# Patient Record
Sex: Male | Born: 1940 | Race: White | Hispanic: No | Marital: Married | Smoking: Never smoker
Health system: Southern US, Community
[De-identification: ages and names within clinical notes are randomized; demographics above are authoritative.]

## PROBLEM LIST (undated history)

## (undated) DIAGNOSIS — R0602 Shortness of breath: Secondary | ICD-10-CM

## (undated) DIAGNOSIS — I480 Paroxysmal atrial fibrillation: Secondary | ICD-10-CM

## (undated) DIAGNOSIS — N433 Hydrocele, unspecified: Secondary | ICD-10-CM

## (undated) DIAGNOSIS — N400 Enlarged prostate without lower urinary tract symptoms: Secondary | ICD-10-CM

## (undated) DIAGNOSIS — M1990 Unspecified osteoarthritis, unspecified site: Secondary | ICD-10-CM

## (undated) HISTORY — PX: CATARACT EXTRACTION W/ INTRAOCULAR LENS  IMPLANT, BILATERAL: SHX1307

## (undated) HISTORY — DX: Shortness of breath: R06.02

## (undated) HISTORY — PX: CARDIAC SURGERY: SHX584

---

## 2010-12-01 DIAGNOSIS — I4891 Unspecified atrial fibrillation: Secondary | ICD-10-CM

## 2010-12-05 ENCOUNTER — Encounter: Payer: Self-pay | Admitting: Physician Assistant

## 2010-12-15 ENCOUNTER — Encounter: Payer: Self-pay | Admitting: Cardiology

## 2010-12-16 ENCOUNTER — Encounter: Payer: Self-pay | Admitting: Cardiology

## 2010-12-16 ENCOUNTER — Ambulatory Visit (INDEPENDENT_AMBULATORY_CARE_PROVIDER_SITE_OTHER): Payer: 59 | Admitting: Cardiology

## 2010-12-16 DIAGNOSIS — R001 Bradycardia, unspecified: Secondary | ICD-10-CM

## 2010-12-16 DIAGNOSIS — R739 Hyperglycemia, unspecified: Secondary | ICD-10-CM

## 2010-12-16 DIAGNOSIS — I4891 Unspecified atrial fibrillation: Secondary | ICD-10-CM

## 2010-12-16 DIAGNOSIS — I498 Other specified cardiac arrhythmias: Secondary | ICD-10-CM

## 2010-12-16 DIAGNOSIS — R7309 Other abnormal glucose: Secondary | ICD-10-CM

## 2010-12-16 MED ORDER — FLECAINIDE ACETATE 50 MG PO TABS: 50.0000 mg | ORAL_TABLET | Freq: Two times a day (BID) | ORAL | Status: DC

## 2010-12-16 NOTE — Progress Notes (Signed)
HPI The patient presents for followup of hospitalization for atrial fibrillation and chest discomfort.  He converted spontaneously to sinus rhythm after beta blocker.  He had another episode for which he presented to the emergency room. This again converted spontaneously. This is now his third episode in one month there is I did review studies done as an outpatient which included echocardiogram with a normal ejection fraction and no valvular abnormalities. Carotid Dopplers demonstrating no hemodynamically significant stenosis. Stress perfusion imaging demonstrating normal EF no scar or ischemia.  The patient feels the palpitations when they occur. They may last up to 2 hours. He feels a rapid heart rate with lightheadedness. He does not report chest pressure, neck or arm discomfort. He does not report shortness of breath, PND or orthopnea. He otherwise is an active gentleman without symptoms.  No Known Allergies  Current Outpatient Prescriptions  Medication Sig Dispense Refill  . acetaminophen (TYLENOL) 325 MG tablet Take 650 mg by mouth every 6 (six) hours as needed.        Marland Kitchen aspirin 81 MG tablet Take 81 mg by mouth daily.        . diflunisal (DOLOBID) 500 MG TABS Take 500 mg by mouth 2 (two) times daily.        . metoprolol succinate (TOPROL-XL) 25 MG 24 hr tablet       . Tamsulosin HCl (FLOMAX) 0.4 MG CAPS Take 0.4 mg by mouth daily.        Marland Kitchen terazosin (HYTRIN) 2 MG capsule Take 2 mg by mouth at bedtime.        Marland Kitchen azithromycin (ZITHROMAX) 250 MG tablet Take 2 tablets by mouth on day 1, followed by 1 tablet by mouth daily for 4 days.      Marland Kitchen gabapentin (NEURONTIN) 300 MG capsule Take 300 mg by mouth at bedtime as needed.          Past Medical History  Diagnosis Date  . Atrial fibrillation     History of Paroxysmal  . Shortness of breath   . Other chest pain   . Other abnormal glucose   . Hypopotassemia   . Hypertrophy of prostate without urinary obstruction and other lower urinary tract  symptoms (LUTS)   . Pain in joint, shoulder region   . Encounter for long-term (current) use of aspirin   . Encounter for long-term (current) use of other medications   . Family history of other cardiovascular diseases     Past Surgical History  Procedure Date  . Cataract extraction, bilateral     ROS:  As stated in the HPI and negative for all other systems.  PHYSICAL EXAM BP 103/66  Pulse 45  Ht 5\' 8"  (1.727 m)  Wt 169 lb (76.658 kg)  BMI 25.70 kg/m2 GENERAL:  Well appearing HEENT:  Pupils equal round and reactive, fundi not visualized, oral mucosa unremarkable NECK:  No jugular venous distention, waveform within normal limits, carotid upstroke brisk and symmetric, no bruits, no thyromegaly LYMPHATICS:  No cervical, inguinal adenopathy LUNGS:  Clear to auscultation bilaterally BACK:  No CVA tenderness CHEST:  Unremarkable HEART:  PMI not displaced or sustained,S1 and S2 within normal limits, no S3, no S4, no clicks, no rubs, no murmurs ABD:  Flat, positive bowel sounds normal in frequency in pitch, no bruits, no rebound, no guarding, no midline pulsatile mass, no hepatomegaly, no splenomegaly EXT:  2 plus pulses throughout, no edema, no cyanosis no clubbing SKIN:  No rashes no nodules NEURO:  Cranial  nerves II through XII grossly intact, motor grossly intact throughout [facility]:  Cognitively intact, oriented to person place and time  EKG:  Sinus bradycardia, rate 58, leftward axis, intervals within normal limits, no acute ST-T wave changes  ASSESSMENT AND PLAN

## 2010-12-16 NOTE — Patient Instructions (Addendum)
   Begin Flecainide 50mg  twice a day    Stop Metoprolol  Follow up in  1 month - see appointment for above

## 2010-12-16 NOTE — Assessment & Plan Note (Signed)
I will defer to Criss Rosales, MD

## 2010-12-16 NOTE — Assessment & Plan Note (Signed)
At this point the patient is quite symptomatic with his fibrillation. He has no high risk features that would preclude treatment with antiarrhythmic and I will choose flecainide 50 mg b.i.d. He will come back in one month or sooner if he has recurrent symptoms. He will eventually need followup stress testing on this medication and drug level. We discussed the possibility of meds hydration or medication changes and briefly touched on atrial fibrillation ablation. Right now he is CHADS is 1 and he will continue with ASA.

## 2010-12-16 NOTE — Assessment & Plan Note (Signed)
I will discontinue his beta blocker which he did not tolerate in the past because of bradycardia arrhythmias.

## 2010-12-17 ENCOUNTER — Encounter: Payer: Self-pay | Admitting: Cardiology

## 2011-01-21 ENCOUNTER — Encounter: Payer: Self-pay | Admitting: Cardiology

## 2011-01-21 ENCOUNTER — Ambulatory Visit (INDEPENDENT_AMBULATORY_CARE_PROVIDER_SITE_OTHER): Payer: 59 | Admitting: Cardiology

## 2011-01-21 DIAGNOSIS — I498 Other specified cardiac arrhythmias: Secondary | ICD-10-CM

## 2011-01-21 DIAGNOSIS — I4891 Unspecified atrial fibrillation: Secondary | ICD-10-CM

## 2011-01-21 DIAGNOSIS — R001 Bradycardia, unspecified: Secondary | ICD-10-CM

## 2011-01-21 NOTE — Assessment & Plan Note (Signed)
He is happy with his response to this dose of flecainide. If he has breakthrough arrhythmias I will increase the dose. In one month he is on a stable dose I plan an exercise treadmill test.

## 2011-01-21 NOTE — Assessment & Plan Note (Signed)
I don't suspect is related to his feet but I will evaluate chronotropic at the time of his treadmill.

## 2011-01-21 NOTE — Progress Notes (Signed)
HPI The patient presents for followup of hospitalization for atrial fibrillation and chest discomfort.  He converted spontaneously to sinus rhythm after beta blocker.  He had another episode for which he presented to the emergency room. This again converted spontaneously. This is now his third episode in one month.  At the last visit I started Flecainide.  I stopped the beta blocker as well.  Since that time he had one self-limited episode of fibrillation. He has had a couple brief episodes as well. He denies any chest pressure, neck carotids or arm discomfort. He is in no presyncope or syncope. He is fatigued.  No Known Allergies  Current Outpatient Prescriptions  Medication Sig Dispense Refill  . acetaminophen (TYLENOL) 325 MG tablet Take 650 mg by mouth every 6 (six) hours as needed.        Marland Kitchen aspirin 325 MG EC tablet Take 325 mg by mouth daily.        . diflunisal (DOLOBID) 500 MG TABS Take 500 mg by mouth 2 (two) times daily.        . flecainide (TAMBOCOR) 50 MG tablet Take 1 tablet (50 mg total) by mouth 2 (two) times daily.  60 tablet  6  . gabapentin (NEURONTIN) 300 MG capsule Take 300 mg by mouth at bedtime as needed.        . Tamsulosin HCl (FLOMAX) 0.4 MG CAPS Take 0.4 mg by mouth daily.        Marland Kitchen terazosin (HYTRIN) 2 MG capsule Take 2 mg by mouth at bedtime.        Marland Kitchen DISCONTD: aspirin 81 MG tablet Take 81 mg by mouth daily.        Marland Kitchen DISCONTD: azithromycin (ZITHROMAX) 250 MG tablet Take 2 tablets by mouth on day 1, followed by 1 tablet by mouth daily for 4 days.        Past Medical History  Diagnosis Date  . Atrial fibrillation     History of Paroxysmal  . Shortness of breath   . Other chest pain   . Other abnormal glucose   . Hypopotassemia   . Hypertrophy of prostate without urinary obstruction and other lower urinary tract symptoms (LUTS)   . Pain in joint, shoulder region   . Encounter for long-term (current) use of aspirin   . Encounter for long-term (current) use of other  medications   . Family history of other cardiovascular diseases     Past Surgical History  Procedure Date  . Cataract extraction, bilateral     ROS:  As stated in the HPI and negative for all other systems.  PHYSICAL EXAM BP 120/70  Pulse 52  Resp 16  Ht 5\' 9"  (1.753 m)  Wt 166 lb (75.297 kg)  BMI 24.51 kg/m2 GENERAL:  Well appearing HEENT:  Pupils equal round and reactive, fundi not visualized, oral mucosa unremarkable NECK:  No jugular venous distention, waveform within normal limits, carotid upstroke brisk and symmetric, no bruits, no thyromegaly LYMPHATICS:  No cervical, inguinal adenopathy LUNGS:  Clear to auscultation bilaterally BACK:  No CVA tenderness CHEST:  Unremarkable HEART:  PMI not displaced or sustained,S1 and S2 within normal limits, no S3, no S4, no clicks, no rubs, no murmurs ABD:  Flat, positive bowel sounds normal in frequency in pitch, no bruits, no rebound, no guarding, no midline pulsatile mass, no hepatomegaly, no splenomegaly EXT:  2 plus pulses throughout, no edema, no cyanosis no clubbing SKIN:  No rashes no nodules NEURO:  Cranial nerves II  through XII grossly intact, motor grossly intact throughout [facility]:  Cognitively intact, oriented to person place and time  EKG:  Sinus rhythm, rate 52, axis within normal limits, intervals within normal limits, no acute ST-T wave changes.   ASSESSMENT AND PLAN

## 2011-01-21 NOTE — Patient Instructions (Signed)
You are being scheduled for an exercise treadmill test with Dr Antoine Poche.  Please follow the instruction sheet given. Continue all medications as listed.

## 2011-02-19 ENCOUNTER — Encounter: Payer: 59 | Admitting: Cardiology

## 2015-09-16 DIAGNOSIS — R05 Cough: Secondary | ICD-10-CM | POA: Diagnosis not present

## 2015-09-16 DIAGNOSIS — J209 Acute bronchitis, unspecified: Secondary | ICD-10-CM | POA: Diagnosis not present

## 2015-10-17 ENCOUNTER — Other Ambulatory Visit: Payer: Self-pay | Admitting: Urology

## 2015-10-23 ENCOUNTER — Encounter (HOSPITAL_BASED_OUTPATIENT_CLINIC_OR_DEPARTMENT_OTHER): Payer: Self-pay | Admitting: *Deleted

## 2015-10-24 ENCOUNTER — Encounter (HOSPITAL_BASED_OUTPATIENT_CLINIC_OR_DEPARTMENT_OTHER): Payer: Self-pay | Admitting: *Deleted

## 2015-10-24 NOTE — Progress Notes (Signed)
[facility] on arrival-instructed Npo after Mn-

## 2015-10-27 NOTE — Anesthesia Preprocedure Evaluation (Signed)
Anesthesia Evaluation  Patient identified by MRN, date of birth, ID band Patient awake    Reviewed: Allergy & Precautions, H&P , NPO status , Patient's Chart, lab work & pertinent test results  Airway Mallampati: II  TM Distance: >3 FB Neck ROM: full    Dental no notable dental hx. (+) Dental Advisory Given, Teeth Intact   Pulmonary neg pulmonary ROS, shortness of breath and with exertion,    Pulmonary exam normal breath sounds clear to auscultation       Cardiovascular Exercise Tolerance: Good negative cardio ROS Normal cardiovascular exam+ dysrhythmias Atrial Fibrillation  Rhythm:regular Rate:Normal     Neuro/Psych negative neurological ROS  negative psych ROS   GI/Hepatic negative GI ROS, Neg liver ROS,   Endo/Other  negative endocrine ROS  Renal/GU negative Renal ROS  negative genitourinary   Musculoskeletal   Abdominal   Peds  Hematology negative hematology ROS (+)   Anesthesia Other Findings   Reproductive/Obstetrics negative OB ROS                             Anesthesia Physical Anesthesia Plan  ASA: III  Anesthesia Plan: General   Post-op Pain Management:    Induction: Intravenous  Airway Management Planned: Oral ETT  Additional Equipment:   Intra-op Plan:   Post-operative Plan: Extubation in OR  Informed Consent: I have reviewed the patients History and Physical, chart, labs and discussed the procedure including the risks, benefits and alternatives for the proposed anesthesia with the patient or authorized representative who has indicated his/her understanding and acceptance.   Dental Advisory Given  Plan Discussed with: CRNA  Anesthesia Plan Comments:         Anesthesia Quick Evaluation

## 2015-10-28 ENCOUNTER — Ambulatory Visit (HOSPITAL_BASED_OUTPATIENT_CLINIC_OR_DEPARTMENT_OTHER): Payer: No Typology Code available for payment source | Admitting: Anesthesiology

## 2015-10-28 ENCOUNTER — Ambulatory Visit (HOSPITAL_BASED_OUTPATIENT_CLINIC_OR_DEPARTMENT_OTHER)
Admission: RE | Admit: 2015-10-28 | Discharge: 2015-10-28 | Disposition: A | Discharge status: Home / Self Care | Payer: No Typology Code available for payment source | Source: Ambulatory Visit | Attending: Urology | Admitting: Urology

## 2015-10-28 ENCOUNTER — Encounter (HOSPITAL_BASED_OUTPATIENT_CLINIC_OR_DEPARTMENT_OTHER)
Admission: RE | Disposition: A | Discharge status: Home / Self Care | Payer: Self-pay | Source: Ambulatory Visit | Attending: Urology

## 2015-10-28 ENCOUNTER — Encounter (HOSPITAL_BASED_OUTPATIENT_CLINIC_OR_DEPARTMENT_OTHER): Payer: Self-pay | Admitting: *Deleted

## 2015-10-28 DIAGNOSIS — I499 Cardiac arrhythmia, unspecified: Secondary | ICD-10-CM | POA: Diagnosis not present

## 2015-10-28 DIAGNOSIS — N400 Enlarged prostate without lower urinary tract symptoms: Secondary | ICD-10-CM | POA: Diagnosis not present

## 2015-10-28 DIAGNOSIS — I4891 Unspecified atrial fibrillation: Secondary | ICD-10-CM | POA: Diagnosis not present

## 2015-10-28 DIAGNOSIS — Z7982 Long term (current) use of aspirin: Secondary | ICD-10-CM | POA: Insufficient documentation

## 2015-10-28 DIAGNOSIS — I4510 Unspecified right bundle-branch block: Secondary | ICD-10-CM | POA: Diagnosis not present

## 2015-10-28 DIAGNOSIS — N433 Hydrocele, unspecified: Secondary | ICD-10-CM | POA: Insufficient documentation

## 2015-10-28 DIAGNOSIS — Z79899 Other long term (current) drug therapy: Secondary | ICD-10-CM | POA: Insufficient documentation

## 2015-10-28 HISTORY — PX: HYDROCELE EXCISION: SHX482

## 2015-10-28 HISTORY — DX: Unspecified osteoarthritis, unspecified site: M19.90

## 2015-10-28 HISTORY — DX: Paroxysmal atrial fibrillation: I48.0

## 2015-10-28 HISTORY — DX: Benign prostatic hyperplasia without lower urinary tract symptoms: N40.0

## 2015-10-28 HISTORY — DX: Hydrocele, unspecified: N43.3

## 2015-10-28 LAB — POCT HEMOGLOBIN-HEMACUE: Hemoglobin: 13.6 g/dL (ref 13.0–17.0)

## 2015-10-28 SURGERY — HYDROCELECTOMY
Anesthesia: General | Laterality: Right

## 2015-10-28 MED ORDER — BACITRACIN-NEOMYCIN-POLYMYXIN 400-5-5000 EX OINT
TOPICAL_OINTMENT | CUTANEOUS | Status: AC
Start: 2015-10-28 — End: 2015-10-28
  Filled 2015-10-28: qty 1

## 2015-10-28 MED ORDER — CEFAZOLIN SODIUM-DEXTROSE 2-4 GM/100ML-% IV SOLN
2.0000 g | INTRAVENOUS | Status: AC
  Administered 2015-10-28: 2 g via INTRAVENOUS
  Filled 2015-10-28: qty 100

## 2015-10-28 MED ORDER — EPHEDRINE SULFATE 50 MG/ML IJ SOLN
INTRAMUSCULAR | Status: DC | PRN
  Administered 2015-10-28 (×3): 10 mg via INTRAVENOUS

## 2015-10-28 MED ORDER — ONDANSETRON HCL 4 MG/2ML IJ SOLN
INTRAMUSCULAR | Status: DC | PRN
  Administered 2015-10-28: 4 mg via INTRAVENOUS

## 2015-10-28 MED ORDER — MIDAZOLAM HCL 2 MG/2ML IJ SOLN
INTRAMUSCULAR | Status: AC
  Filled 2015-10-28: qty 2

## 2015-10-28 MED ORDER — LACTATED RINGERS IV SOLN
INTRAVENOUS | Status: DC
  Filled 2015-10-28: qty 1000

## 2015-10-28 MED ORDER — HYDROCODONE-ACETAMINOPHEN 5-325 MG PO TABS
ORAL_TABLET | ORAL | Status: AC
  Filled 2015-10-28: qty 1

## 2015-10-28 MED ORDER — PROPOFOL 10 MG/ML IV BOLUS
INTRAVENOUS | Status: AC
  Filled 2015-10-28: qty 40

## 2015-10-28 MED ORDER — CEFAZOLIN SODIUM-DEXTROSE 2-3 GM-% IV SOLR
INTRAVENOUS | Status: AC
  Filled 2015-10-28: qty 50

## 2015-10-28 MED ORDER — BUPIVACAINE-EPINEPHRINE (PF) 0.5% -1:200000 IJ SOLN
INTRAMUSCULAR | Status: AC
  Filled 2015-10-28: qty 30

## 2015-10-28 MED ORDER — DEXAMETHASONE SODIUM PHOSPHATE 4 MG/ML IJ SOLN
INTRAMUSCULAR | Status: DC | PRN
  Administered 2015-10-28: 5 mg via INTRAVENOUS

## 2015-10-28 MED ORDER — FENTANYL CITRATE (PF) 100 MCG/2ML IJ SOLN
INTRAMUSCULAR | Status: AC
  Filled 2015-10-28: qty 2

## 2015-10-28 MED ORDER — HYDROCODONE-ACETAMINOPHEN 5-325 MG PO TABS
1.0000 | ORAL_TABLET | Freq: Once | ORAL | Status: AC
  Administered 2015-10-28: 1 via ORAL
  Filled 2015-10-28: qty 1

## 2015-10-28 MED ORDER — BUPIVACAINE-EPINEPHRINE 0.5% -1:200000 IJ SOLN
INTRAMUSCULAR | Status: DC | PRN
Start: 2015-10-28 — End: 2015-10-28
  Administered 2015-10-28: 16 mL

## 2015-10-28 MED ORDER — TRIPLE ANTIBIOTIC 3.5-400-5000 EX OINT
TOPICAL_OINTMENT | CUTANEOUS | Status: DC | PRN
  Administered 2015-10-28: 1 via TOPICAL

## 2015-10-28 MED ORDER — ONDANSETRON HCL 4 MG/2ML IJ SOLN
INTRAMUSCULAR | Status: AC
  Filled 2015-10-28: qty 2

## 2015-10-28 MED ORDER — FENTANYL CITRATE (PF) 100 MCG/2ML IJ SOLN
25.0000 ug | INTRAMUSCULAR | Status: DC | PRN
Start: 2015-10-28 — End: 2015-10-28
  Filled 2015-10-28: qty 1

## 2015-10-28 MED ORDER — LIDOCAINE HCL (CARDIAC) 20 MG/ML IV SOLN
INTRAVENOUS | Status: DC | PRN
  Administered 2015-10-28: 60 mg via INTRAVENOUS

## 2015-10-28 MED ORDER — DEXAMETHASONE SODIUM PHOSPHATE 10 MG/ML IJ SOLN
INTRAMUSCULAR | Status: AC
  Filled 2015-10-28: qty 1

## 2015-10-28 MED ORDER — FENTANYL CITRATE (PF) 100 MCG/2ML IJ SOLN
INTRAMUSCULAR | Status: DC | PRN
  Administered 2015-10-28: 12.5 ug via INTRAVENOUS
  Administered 2015-10-28 (×2): 25 ug via INTRAVENOUS
  Administered 2015-10-28: 12.5 ug via INTRAVENOUS
  Administered 2015-10-28: 25 ug via INTRAVENOUS

## 2015-10-28 MED ORDER — LACTATED RINGERS IV SOLN
INTRAVENOUS | Status: DC
  Administered 2015-10-28 (×2): via INTRAVENOUS
  Filled 2015-10-28: qty 1000

## 2015-10-28 MED ORDER — PROPOFOL 10 MG/ML IV BOLUS
INTRAVENOUS | Status: AC
  Filled 2015-10-28: qty 20

## 2015-10-28 MED ORDER — HYDROCODONE-ACETAMINOPHEN 5-325 MG PO TABS: 1.0000 | ORAL_TABLET | Freq: Four times a day (QID) | ORAL | Status: DC | PRN

## 2015-10-28 MED ORDER — PROPOFOL 10 MG/ML IV BOLUS
INTRAVENOUS | Status: DC | PRN
  Administered 2015-10-28: 160 mg via INTRAVENOUS
  Administered 2015-10-28: 40 mg via INTRAVENOUS

## 2015-10-28 SURGICAL SUPPLY — 45 items
BLADE CLIPPER SURG (BLADE) ×2 IMPLANT
BLADE SURG 15 STRL LF DISP TIS (BLADE) ×1 IMPLANT
BLADE SURG 15 STRL SS (BLADE) ×1
BNDG GAUZE ELAST 4 BULKY (GAUZE/BANDAGES/DRESSINGS) ×2 IMPLANT
CANISTER SUCTION 1200CC (MISCELLANEOUS) IMPLANT
CANISTER SUCTION 2500CC (MISCELLANEOUS) ×2 IMPLANT
CLEANER CAUTERY TIP 5X5 PAD (MISCELLANEOUS) IMPLANT
COVER BACK TABLE 60X90IN (DRAPES) ×2 IMPLANT
COVER MAYO STAND STRL (DRAPES) ×2 IMPLANT
DISSECTOR ROUND CHERRY 3/8 STR (MISCELLANEOUS) IMPLANT
DRAIN PENROSE 18X1/4 LTX STRL (WOUND CARE) IMPLANT
DRAPE LAPAROTOMY 100X72 PEDS (DRAPES) ×2 IMPLANT
ELECT REM PT RETURN 9FT ADLT (ELECTROSURGICAL) ×2
ELECTRODE REM PT RTRN 9FT ADLT (ELECTROSURGICAL) ×1 IMPLANT
GLOVE BIO SURGEON STRL SZ7 (GLOVE) ×2 IMPLANT
GLOVE BIO SURGEON STRL SZ8 (GLOVE) ×2 IMPLANT
GLOVE BIOGEL PI IND STRL 7.0 (GLOVE) ×2 IMPLANT
GLOVE BIOGEL PI IND STRL 8 (GLOVE) ×1 IMPLANT
GLOVE BIOGEL PI INDICATOR 7.0 (GLOVE) ×2
GLOVE BIOGEL PI INDICATOR 8 (GLOVE) ×1
GLOVE ECLIPSE 7.5 STRL STRAW (GLOVE) ×2 IMPLANT
GOWN STRL REUS W/ TWL LRG LVL3 (GOWN DISPOSABLE) ×1 IMPLANT
GOWN STRL REUS W/ TWL XL LVL3 (GOWN DISPOSABLE) ×2 IMPLANT
GOWN STRL REUS W/TWL LRG LVL3 (GOWN DISPOSABLE) ×1
GOWN STRL REUS W/TWL XL LVL3 (GOWN DISPOSABLE) ×2
KIT ROOM TURNOVER WOR (KITS) ×2 IMPLANT
NEEDLE HYPO 25X1 1.5 SAFETY (NEEDLE) ×2 IMPLANT
NS IRRIG 500ML POUR BTL (IV SOLUTION) ×2 IMPLANT
PACK BASIN DAY SURGERY FS (CUSTOM PROCEDURE TRAY) ×2 IMPLANT
PAD CLEANER CAUTERY TIP 5X5 (MISCELLANEOUS)
PENCIL BUTTON HOLSTER BLD 10FT (ELECTRODE) ×2 IMPLANT
SUPPORT SCROTAL LG STRP (MISCELLANEOUS) ×2 IMPLANT
SUT CHROMIC 3 0 SH 27 (SUTURE) ×4 IMPLANT
SUT ETHILON 3 0 PS 1 (SUTURE) IMPLANT
SUT SILK 4 0 TIES 17X18 (SUTURE) IMPLANT
SUT VIC AB 4-0 RB1 27 (SUTURE)
SUT VIC AB 4-0 RB1 27X BRD (SUTURE) IMPLANT
SUT VICRYL 6 0 RB 1 (SUTURE) IMPLANT
SYR CONTROL 10ML LL (SYRINGE) ×2 IMPLANT
TOWEL OR 17X24 6PK STRL BLUE (TOWEL DISPOSABLE) ×4 IMPLANT
TRAY DSU PREP LF (CUSTOM PROCEDURE TRAY) ×2 IMPLANT
TUBE CONNECTING 12X1/4 (SUCTIONS) ×2 IMPLANT
WATER STERILE IRR 3000ML UROMA (IV SOLUTION) IMPLANT
WATER STERILE IRR 500ML POUR (IV SOLUTION) IMPLANT
YANKAUER SUCT BULB TIP NO VENT (SUCTIONS) ×2 IMPLANT

## 2015-10-28 NOTE — Discharge Instructions (Signed)
Scrotal surgery postoperative instructions ° °Wound: ° °In most cases your incision will have absorbable sutures that will dissolve within the first 10-20 days. Some will fall out even earlier. Expect some redness as the sutures dissolved but this should occur only around the sutures. If there is generalized redness, especially with increasing pain or swelling, let us know. The scrotum will very likely get "black and blue" as the blood in the tissues spread. Sometimes the whole scrotum will turn colors. The black and blue is followed by a yellow and brown color. In time, all the discoloration will go away. In some cases some firm swelling in the area of the testicle may persist for up to 4-6 weeks after the surgery and is considered normal in most cases. ° °Diet: ° °You may return to your normal diet within 24 hours following your surgery. You may note some mild nausea and possibly vomiting the first 6-8 hours following surgery. This is usually due to the side effects of anesthesia, and will disappear quite soon. I would suggest clear liquids and a very light meal the first evening following your surgery. ° °Activity: ° °Your physical activity should be restricted the first 48 hours. During that time you should remain relatively inactive, moving about only when necessary. During the first 7-10 days following surgery he should avoid lifting any heavy objects (anything greater than 15 pounds), and avoid strenuous exercise. If you work, ask us specifically about your restrictions, both for work and home. We will write a note to your employer if needed. ° °You should plan to wear a tight pair of jockey shorts or an athletic supporter for the first 4-5 days, even to sleep. This will keep the scrotum immobilized to some degree and keep the swelling down. ° °Ice packs should be placed on and off over the scrotum for the first 48 hours. Frozen peas or corn in a ZipLock bag can be frozen, used and re-frozen. Fifteen minutes  on and 15 minutes off is a reasonable schedule. The ice is a good pain reliever and keeps the swelling down. ° °Hygiene: ° °You may shower 48 hours after your surgery. Tub bathing should be restricted until the seventh day. ° ° ° ° ° ° ° ° ° °Medication: ° °You will be sent home with some type of pain medication. In many cases you will be sent home with a narcotic pain pill (hydrococone or oxycodone). If the pain is not too bad, you may take either Tylenol (acetaminophen) or Advil (ibuprofen) which contain no narcotic agents, and might be tolerated a little better, with fewer side effects. If the pain medication you are sent home with does not control the pain, you will have to let us know. Some narcotic pain medications cannot be given or refilled by a phone call to a pharmacy. ° °Problems you should report to us: ° °· Fever of 101.0 degrees Fahrenheit or greater. °· Moderate or severe swelling under the skin incision or involving the scrotum. °· Drug reaction such as hives, a rash, nausea or vomiting. °·  ° ° °Post Anesthesia Home Care Instructions ° °Activity: °Get plenty of rest for the remainder of the day. A responsible adult should stay with you for 24 hours following the procedure.  °For the next 24 hours, DO NOT: °-Drive a car °-Operate machinery °-Drink alcoholic beverages °-Take any medication unless instructed by your physician °-Make any legal decisions or sign important papers. ° °Meals: °Start with liquid foods such as   gelatin or soup. Progress to regular foods as tolerated. Avoid greasy, spicy, heavy foods. If nausea and/or vomiting occur, drink only clear liquids until the nausea and/or vomiting subsides. Call your physician if vomiting continues. ° °Special Instructions/Symptoms: °Your throat may feel dry or sore from the anesthesia or the breathing tube placed in your throat during surgery. If this causes discomfort, gargle with warm salt water. The discomfort should disappear within 24  hours. ° °If you had a scopolamine patch placed behind your ear for the management of post- operative nausea and/or vomiting: ° °1. The medication in the patch is effective for 72 hours, after which it should be removed.  Wrap patch in a tissue and discard in the trash. Wash hands thoroughly with soap and water. °2. You may remove the patch earlier than 72 hours if you experience unpleasant side effects which may include dry mouth, dizziness or visual disturbances. °3. Avoid touching the patch. Wash your hands with soap and water after contact with the patch. °  ° °

## 2015-10-28 NOTE — Anesthesia Postprocedure Evaluation (Signed)
Anesthesia Post Note  Patient: Kyle Bentley  Procedure(s) Performed: Procedure(s) (LRB): RIGHT HYDROCELECTOMY  (Right)  Patient location during evaluation: PACU Anesthesia Type: General Level of consciousness: awake and alert Pain management: pain level controlled Vital Signs Assessment: post-procedure vital signs reviewed and stable Respiratory status: spontaneous breathing, nonlabored ventilation, respiratory function stable and patient connected to nasal cannula oxygen Cardiovascular status: blood pressure returned to baseline and stable Postop Assessment: no signs of nausea or vomiting Anesthetic complications: no    Last Vitals:  Filed Vitals:   10/28/15 0845 10/28/15 0900  BP: 130/60 125/60  Pulse: 69 65  Temp:    Resp: 13 10    Last Pain:  Filed Vitals:   10/28/15 0913  PainSc: 3                  Kattia Selley L

## 2015-10-28 NOTE — H&P (Signed)
History of Present Illness Elevated PSA: He was found to have a PSA of 4.25 in 6/16 with a PSA in 3/16 that was 4.68. No abnormality of the prostate was noted on DRE.      He does have BPH which has been managed with terazosin 5 mg daily. He said he is currently satisfied with his voiding pattern.     He has a right hydrocele that he said has had to have fluid drawn off of it in the past. He said he has not required any fluid to be drawn off of it in several years however and it is not causing him any difficulty in any way.    Interval history: When I saw him last I had recommended repeating his PSA in 3 months and again at 6 months and it appears he had his PSA done at the [place]. He brought those results with him and his PSA in 12/16 was actually normal at 3.95. It had decreased further and he remains symptomatic from a voiding standpoint at this time. He does however have a symptomatic right hydrocele. He said it is uncomfortable when he rides his motorcycle. He said in the past he has had the fluid aspirated but it keeps coming back and he asked if I would aspirate the fluid.   Past Medical History Problems  1. History of arthritis (Z87.39) 2. History of atrial fibrillation (Z86.79)  Surgical History Problems  1. History of No Surgical Problems  Current Meds 1. Aspirin EC Low Dose 81 MG Oral Tablet Delayed Release;  Therapy: (Recorded:13Sep2016) to Recorded 2. Flecainide Acetate 50 MG Oral Tablet;  Therapy: (Recorded:13Sep2016) to Recorded 3. Fluticasone Propionate 50 MCG/ACT Nasal Suspension;  Therapy: (Recorded:13Sep2016) to Recorded 4. Ibuprofen 200 MG Oral Tablet;  Therapy: (Recorded:13Sep2016) to Recorded 5. Terazosin HCl 5 MG TABS;  Therapy: (Recorded:13Sep2016) to Recorded 6. Vitamin D3 5000 UNIT Oral Tablet;  Therapy: (Recorded:13Sep2016) to Recorded  Allergies Medication  1. No Known Drug Allergies  Family History Problems  1. Family history of acute myocardial  infarction (Z82.49) : Father 2. Family history of cardiac disorder (Z82.49) : Father, Sister, Brother  Social History Problems  1. Denied: History of Alcohol use 2. Caffeine use (F15.90)   2 3. Father deceased 10. Married 5. Mother deceased 45. Never a smoker 7. Number of children   1 son; 1 daughter 35. Retired  Review of Systems Genitourinary, constitutional, skin, eye, otolaryngeal, hematologic/lymphatic, cardiovascular, pulmonary, endocrine, musculoskeletal, gastrointestinal, neurological and psychiatric system(s) were reviewed and pertinent findings if present are noted and are otherwise negative.  Genitourinary: urinary frequency, feelings of urinary urgency, nocturia, difficulty starting the urinary stream, weak urinary stream, urinary stream starts and stops, erectile dysfunction and initiating urination requires straining.  Gastrointestinal: constipation.  Constitutional: feeling tired (fatigue).  Respiratory: shortness of breath.  Musculoskeletal: joint pain.  Neurological: headache.      Physical Exam Constitutional: Well nourished and well developed . No acute distress.  ENT:. The ears and nose are normal in appearance.  Neck: The appearance of the neck is normal and no neck mass is present.  Pulmonary: No respiratory distress and normal respiratory rhythm and effort.  Cardiovascular: Heart rate and rhythm are normal . No peripheral edema.  Abdomen: The abdomen is soft and nontender. No masses are palpated. No CVA tenderness. No hernias are palpable. No hepatosplenomegaly noted.  Rectal: Rectal exam demonstrates normal sphincter tone, no tenderness and no masses. The prostate has no nodularity and is not tender. The left  seminal vesicle is nonpalpable. The right seminal vesicle is nonpalpable. The perineum is normal on inspection.  Genitourinary: Examination of the penis demonstrates no discharge, no masses, no lesions and a normal meatus. The penis is uncircumcised. The  scrotum is without lesions. Examination of the right scrotum demonstrates a hydrocele. The right epididymis is palpably normal and non-tender. The left epididymis is palpably normal and non-tender. The right testis is non-tender and without masses. The left testis is non-tender and without masses.  Lymphatics: The femoral and inguinal nodes are not enlarged or tender.  Skin: Normal skin turgor, no visible rash and no visible skin lesions.  Neuro/Psych:. Mood and affect are appropriate.      Vitals Vital Signs  Height: 5 ft 7 in Weight: 158 lb  BMI Calculated: 24.75 BSA Calculated: 1.83 Blood Pressure: 117 / 56 Heart Rate: 64   The following clinical lab reports were reviewed:  UA: Clear.    Assessment   His prostate is noted to be smooth and benign again today. His PSA has continued to fall and is currently 3.95. Yearly DRE and PSA has been my recommendation at this time.    He has a symptomatic right hydrocele. We discussed the treatment options and certainly continued repeat aspirations of the fluid is an option but I did not feel that that was likely the best option since it is not definitively treating the cause and it is resolving and recurrence as one would expect. I therefore have discussed hydrocelectomy as a more definitive treatment. We discussed the fact that it is a surgical procedure and I went over the incision used, the risks and complications, the probability of success, the outpatient nature of the procedure as well as the anticipated postoperative course. He would like to proceed with definitive therapy.     Plan   He is scheduled for a right hydrocelectomy.

## 2015-10-28 NOTE — Op Note (Signed)
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 His genitalia was then sterilely prepped and draped. An official timeout was then performed.  A midline median raphae scrotal incision was then made and carried down over the right hydrocele. The tissue over the hydrocele was cleared using a combination of sharp and blunt technique. The hydrocele was then opened, drained of clear amber fluid and delivered through the incision. The excess hydrocele sac tissue was excised with the Bovie electrocautery and then I cauterized the edges. I then reapproximated the edges posteriorly behind the epididymis with a running, locking 3-0 chromic suture. The appendix testis was removed with the Bovie.   His testicle was then replaced in the normal anatomic position and his right hemiscrotum. I then closed the deep scrotal tissue with running 3-0 chromic suture in a locking fashion. I injected quarter percent Marcaine in the subcutaneous tissue and closed the skin with running 3-0 chromic. Neosporin, a sterile gauze dressing, fluff Kerlix and a scrotal support were applied. The patient tolerated the procedure well no intraoperative complications. Needle sponge and instrument counts were correct at the end of the operation.   PLAN OF CARE: Discharge to home after PACU  PATIENT DISPOSITION:  PACU - hemodynamically stable.  I was present and participated in the entire operation with the resident.

## 2015-10-28 NOTE — Anesthesia Procedure Notes (Signed)
Procedure Name: LMA Insertion Date/Time: 10/28/2015 7:35 AM Performed by: Jessica PriestBEESON, Allyse Fregeau C Pre-anesthesia Checklist: Patient identified, Emergency Drugs available, Suction available and Patient being monitored Patient Re-evaluated:Patient Re-evaluated prior to inductionOxygen Delivery Method: Circle System Utilized Preoxygenation: Pre-oxygenation with 100% oxygen Intubation Type: IV induction Ventilation: Mask ventilation without difficulty LMA: LMA inserted LMA Size: 4.0 Number of attempts: 1 Airway Equipment and Method: Bite block Placement Confirmation: positive ETCO2 Tube secured with: Tape Dental Injury: Teeth and Oropharynx as per pre-operative assessment

## 2015-10-28 NOTE — Transfer of Care (Signed)
Immediate Anesthesia Transfer of Care Note  Patient: Kyle Bentley  Procedure(s) Performed: Procedure(s) (LRB): RIGHT HYDROCELECTOMY  (Right)  Patient Location: PACU  Anesthesia Type: General  Level of Consciousness: awake, sedated, patient cooperative and responds to stimulation  Airway & Oxygen Therapy: Patient Spontanous Breathing and Patient connected to face mask oxygen  Post-op Assessment: Report given to PACU RN, Post -op Vital signs reviewed and stable and Patient moving all extremities  Post vital signs: Reviewed and stable  Complications: No apparent anesthesia complications

## 2015-10-29 ENCOUNTER — Encounter (HOSPITAL_BASED_OUTPATIENT_CLINIC_OR_DEPARTMENT_OTHER): Payer: Self-pay | Admitting: Urology

## 2015-11-07 ENCOUNTER — Encounter (HOSPITAL_BASED_OUTPATIENT_CLINIC_OR_DEPARTMENT_OTHER): Payer: Self-pay | Admitting: Urology

## 2016-05-06 DIAGNOSIS — Z0100 Encounter for examination of eyes and vision without abnormal findings: Secondary | ICD-10-CM | POA: Diagnosis not present

## 2016-09-17 DIAGNOSIS — Z6826 Body mass index (BMI) 26.0-26.9, adult: Secondary | ICD-10-CM | POA: Diagnosis not present

## 2016-09-17 DIAGNOSIS — Z23 Encounter for immunization: Secondary | ICD-10-CM | POA: Diagnosis not present

## 2016-09-17 DIAGNOSIS — S65411A Laceration of blood vessel of right thumb, initial encounter: Secondary | ICD-10-CM | POA: Diagnosis not present

## 2016-09-21 DIAGNOSIS — S65411A Laceration of blood vessel of right thumb, initial encounter: Secondary | ICD-10-CM | POA: Diagnosis not present

## 2016-09-21 DIAGNOSIS — Z6826 Body mass index (BMI) 26.0-26.9, adult: Secondary | ICD-10-CM | POA: Diagnosis not present

## 2016-09-29 DIAGNOSIS — Z6826 Body mass index (BMI) 26.0-26.9, adult: Secondary | ICD-10-CM | POA: Diagnosis not present

## 2016-09-29 DIAGNOSIS — S65411D Laceration of blood vessel of right thumb, subsequent encounter: Secondary | ICD-10-CM | POA: Diagnosis not present

## 2016-10-07 DIAGNOSIS — S65411D Laceration of blood vessel of right thumb, subsequent encounter: Secondary | ICD-10-CM | POA: Diagnosis not present

## 2016-10-07 DIAGNOSIS — Z6826 Body mass index (BMI) 26.0-26.9, adult: Secondary | ICD-10-CM | POA: Diagnosis not present

## 2017-04-21 DIAGNOSIS — J0101 Acute recurrent maxillary sinusitis: Secondary | ICD-10-CM | POA: Diagnosis not present

## 2017-04-21 DIAGNOSIS — Z6825 Body mass index (BMI) 25.0-25.9, adult: Secondary | ICD-10-CM | POA: Diagnosis not present

## 2017-08-07 DIAGNOSIS — Z6827 Body mass index (BMI) 27.0-27.9, adult: Secondary | ICD-10-CM | POA: Diagnosis not present

## 2017-08-07 DIAGNOSIS — J209 Acute bronchitis, unspecified: Secondary | ICD-10-CM | POA: Diagnosis not present

## 2017-08-07 DIAGNOSIS — I482 Chronic atrial fibrillation: Secondary | ICD-10-CM | POA: Diagnosis not present

## 2017-08-19 DIAGNOSIS — J209 Acute bronchitis, unspecified: Secondary | ICD-10-CM | POA: Diagnosis not present

## 2017-08-19 DIAGNOSIS — R05 Cough: Secondary | ICD-10-CM | POA: Diagnosis not present

## 2017-08-19 DIAGNOSIS — Z6825 Body mass index (BMI) 25.0-25.9, adult: Secondary | ICD-10-CM | POA: Diagnosis not present

## 2017-11-10 DIAGNOSIS — R0602 Shortness of breath: Secondary | ICD-10-CM | POA: Diagnosis not present

## 2017-11-10 DIAGNOSIS — R42 Dizziness and giddiness: Secondary | ICD-10-CM | POA: Diagnosis not present

## 2017-11-10 DIAGNOSIS — Z79899 Other long term (current) drug therapy: Secondary | ICD-10-CM | POA: Diagnosis not present

## 2017-11-10 DIAGNOSIS — I480 Paroxysmal atrial fibrillation: Secondary | ICD-10-CM | POA: Diagnosis not present

## 2017-11-10 DIAGNOSIS — Z7902 Long term (current) use of antithrombotics/antiplatelets: Secondary | ICD-10-CM | POA: Diagnosis not present

## 2018-02-24 ENCOUNTER — Emergency Department (HOSPITAL_COMMUNITY): Payer: No Typology Code available for payment source

## 2018-02-24 ENCOUNTER — Encounter (HOSPITAL_COMMUNITY): Payer: Self-pay

## 2018-02-24 ENCOUNTER — Other Ambulatory Visit: Payer: Self-pay

## 2018-02-24 ENCOUNTER — Emergency Department (HOSPITAL_COMMUNITY)
Admission: EM | Admit: 2018-02-24 | Discharge: 2018-02-25 | Disposition: A | Discharge status: Other Acute Inpatient Hospital | Payer: No Typology Code available for payment source | Attending: Emergency Medicine | Admitting: Emergency Medicine

## 2018-02-24 DIAGNOSIS — Z951 Presence of aortocoronary bypass graft: Secondary | ICD-10-CM | POA: Diagnosis not present

## 2018-02-24 DIAGNOSIS — J810 Acute pulmonary edema: Secondary | ICD-10-CM | POA: Diagnosis not present

## 2018-02-24 DIAGNOSIS — R0602 Shortness of breath: Secondary | ICD-10-CM | POA: Diagnosis present

## 2018-02-24 LAB — CBC
HEMATOCRIT: 34.1 % — AB (ref 39.0–52.0)
HEMOGLOBIN: 10.7 g/dL — AB (ref 13.0–17.0)
MCH: 30 pg (ref 26.0–34.0)
MCHC: 31.4 g/dL (ref 30.0–36.0)
MCV: 95.5 fL (ref 78.0–100.0)
Platelets: 303 10*3/uL (ref 150–400)
RBC: 3.57 MIL/uL — AB (ref 4.22–5.81)
RDW: 15 % (ref 11.5–15.5)
WBC: 6.2 10*3/uL (ref 4.0–10.5)

## 2018-02-24 LAB — BASIC METABOLIC PANEL
ANION GAP: 7 (ref 5–15)
BUN: 18 mg/dL (ref 8–23)
CHLORIDE: 107 mmol/L (ref 98–111)
CO2: 26 mmol/L (ref 22–32)
Calcium: 9.6 mg/dL (ref 8.9–10.3)
Creatinine, Ser: 1.7 mg/dL — ABNORMAL HIGH (ref 0.61–1.24)
GFR calc non Af Amer: 38 mL/min — ABNORMAL LOW (ref >60)
GFR, EST AFRICAN AMERICAN: 44 mL/min — AB (ref >60)
GLUCOSE: 112 mg/dL — AB (ref 70–99)
POTASSIUM: 4.1 mmol/L (ref 3.5–5.1)
Sodium: 140 mmol/L (ref 135–145)

## 2018-02-24 LAB — I-STAT TROPONIN, ED: Troponin i, poc: 0.02 ng/mL (ref 0.00–0.08)

## 2018-02-24 LAB — BRAIN NATRIURETIC PEPTIDE: B Natriuretic Peptide: 317.9 pg/mL — ABNORMAL HIGH (ref 0.0–100.0)

## 2018-02-24 LAB — I-STAT CG4 LACTIC ACID, ED
LACTIC ACID, VENOUS: 1.8 mmol/L (ref 0.5–1.9)
Lactic Acid, Venous: 1.3 mmol/L (ref 0.5–1.9)

## 2018-02-24 MED ORDER — LEVOTHYROXINE SODIUM 25 MCG PO TABS: 25.0000 ug | ORAL_TABLET | Freq: Every day | ORAL | Status: DC

## 2018-02-24 MED ORDER — AMIODARONE HCL 200 MG PO TABS
200.0000 mg | ORAL_TABLET | Freq: Every day | ORAL | Status: DC
  Administered 2018-02-24: 200 mg via ORAL
  Filled 2018-02-24: qty 1

## 2018-02-24 MED ORDER — ASPIRIN EC 81 MG PO TBEC
81.0000 mg | DELAYED_RELEASE_TABLET | Freq: Every day | ORAL | Status: DC
  Administered 2018-02-24: 81 mg via ORAL
  Filled 2018-02-24: qty 1

## 2018-02-24 MED ORDER — BENZONATATE 100 MG PO CAPS
100.0000 mg | ORAL_CAPSULE | Freq: Once | ORAL | Status: AC
  Administered 2018-02-24: 100 mg via ORAL
  Filled 2018-02-24: qty 1

## 2018-02-24 MED ORDER — TAMSULOSIN HCL 0.4 MG PO CAPS: 0.4000 mg | ORAL_CAPSULE | Freq: Every day | ORAL | Status: DC

## 2018-02-24 MED ORDER — APIXABAN 5 MG PO TABS
5.0000 mg | ORAL_TABLET | Freq: Two times a day (BID) | ORAL | Status: DC
Start: 2018-02-24 — End: 2018-02-25
  Administered 2018-02-24: 5 mg via ORAL
  Filled 2018-02-24: qty 1

## 2018-02-24 MED ORDER — ATORVASTATIN CALCIUM 20 MG PO TABS
20.0000 mg | ORAL_TABLET | Freq: Every day | ORAL | Status: DC
  Administered 2018-02-24: 20 mg via ORAL
  Filled 2018-02-24: qty 1

## 2018-02-24 MED ORDER — POTASSIUM CHLORIDE CRYS ER 10 MEQ PO TBCR: 10.0000 meq | EXTENDED_RELEASE_TABLET | Freq: Every day | ORAL | Status: DC

## 2018-02-24 MED ORDER — FENTANYL CITRATE (PF) 100 MCG/2ML IJ SOLN
50.0000 ug | Freq: Once | INTRAMUSCULAR | Status: AC
  Administered 2018-02-24: 50 ug via INTRAVENOUS
  Filled 2018-02-24: qty 2

## 2018-02-24 MED ORDER — METOPROLOL TARTRATE 25 MG PO TABS
25.0000 mg | ORAL_TABLET | Freq: Two times a day (BID) | ORAL | Status: DC
  Filled 2018-02-24: qty 1

## 2018-02-24 MED ORDER — FENTANYL CITRATE (PF) 100 MCG/2ML IJ SOLN: 50.0000 ug | INTRAMUSCULAR | Status: DC | PRN

## 2018-02-24 MED ORDER — FUROSEMIDE 10 MG/ML IJ SOLN
20.0000 mg | Freq: Once | INTRAMUSCULAR | Status: AC
  Administered 2018-02-24: 20 mg via INTRAVENOUS
  Filled 2018-02-24: qty 2

## 2018-02-24 MED ORDER — FLUTICASONE PROPIONATE 50 MCG/ACT NA SUSP
2.0000 | Freq: Every day | NASAL | Status: DC | PRN
  Filled 2018-02-24: qty 16

## 2018-02-24 NOTE — ED Provider Notes (Signed)
Emergency Department Provider Note   I have reviewed the triage vital signs and the nursing notes.   HISTORY  Chief Complaint Weakness and Shortness of Breath   HPI Kyle Bentley is a 77 y.o. male who was recently discharged from the [facility] secondary to CABG.  He came home on Monday has had progressively worsening cough, shortness of breath and extremity swelling since that time.  [facility] who sent him here for further evaluation.  Fevers but has generalized weakness.  No nausea, vomiting diaphoresis or dizziness. No other associated or modifying symptoms.    Past Medical History:  Diagnosis Date  . Arthritis    general  . BPH (benign prostatic hypertrophy)   . PAF (paroxysmal atrial fibrillation) (HCC)   . Right hydrocele   . Shortness of breath    with excertion only    Patient Active Problem List   Diagnosis Date Noted  . Bradycardia 12/16/2010  . Atrial fibrillation (HCC) 12/16/2010  . Hyperglycemia 12/16/2010    Past Surgical History:  Procedure Laterality Date  . CARDIAC SURGERY    . CATARACT EXTRACTION W/ INTRAOCULAR LENS  IMPLANT, BILATERAL  [id number]  . HYDROCELE EXCISION Right 10/28/2015   Procedure: RIGHT HYDROCELECTOMY ;  Surgeon: Ihor Gully, MD;  Location: [facility];  Service: Urology;  Laterality: Right;    Current Outpatient Rx  . Order #: [id number] Class: Historical Med  . Order #: [id number] Class: Historical Med  . Order #: [id number] Class: Historical Med  . Order #: [id number] Class: Historical Med  . Order #: [id number] Class: Historical Med  . Order #: [id number] Class: Historical Med  . Order #: [id number] Class: Historical Med  . Order #: [id number] Class: Historical Med  . Order #: [id number] Class: Historical Med  . Order #: [id number] Class: Historical Med  . Order #: [id number] Class: Historical Med  . Order #: [id number] Class: Historical Med  . Order #: [id number] Class: Historical Med  . Order  #: [id number] Class: Historical Med  . Order #: [id number] Class: Historical Med  . Order #: [id number] Class: Normal    Allergies Patient has no known allergies.  Family History  Problem Relation Age of Onset  . Coronary artery disease Unknown        Positive Family History    Social History Social History   Tobacco Use  . Smoking status: Never Smoker  . Smokeless tobacco: Never Used  Substance Use Topics  . Alcohol use: No  . Drug use: No    Review of Systems  All other systems negative except as documented in the HPI. All pertinent positives and negatives as reviewed in the HPI. ____________________________________________   PHYSICAL EXAM:  VITAL SIGNS: ED Triage Vitals  Enc Vitals Group     BP 02/24/18 1440 (!) 99/55     Pulse Rate 02/24/18 1437 81     Resp 02/24/18 1437 20     Temp 02/24/18 1437 97.6 F (36.4 C)     Temp Source 02/24/18 1437 Oral     SpO2 02/24/18 1437 99 %     Weight 02/24/18 1432 173 lb (78.5 kg)     Height 02/24/18 1432 5\' 6"  (1.676 m)    Constitutional: Alert and oriented. Well appearing and in no acute distress. Eyes: Conjunctivae are normal. PERRL. EOMI. Head: Atraumatic. Nose: No congestion/rhinnorhea. Mouth/Throat: Mucous membranes are moist.  Oropharynx non-erythematous. Neck: No stridor.  No meningeal signs.   Cardiovascular: Normal rate, regular rhythm. Good peripheral circulation. Grossly normal heart sounds.  Respiratory: tachypneic respiratory effort.  No retractions. Lungs with bilateral crackles. Gastrointestinal: Soft and nontender. No distention.  Musculoskeletal: No lower extremity tenderness nor edema. No gross deformities of extremities. Neurologic:  Normal speech and language. No gross focal neurologic deficits are appreciated.  Skin:  Skin is warm, dry and intact. No rash noted.   ____________________________________________   LABS (all labs ordered are listed, but only abnormal results are displayed)  Labs  Reviewed  BASIC METABOLIC PANEL - Abnormal; Notable for the following components:      Result Value   Glucose, Bld 112 (*)    Creatinine, Ser 1.70 (*)    GFR calc non Af Amer 38 (*)    GFR calc Af Amer 44 (*)    All other components within normal limits  CBC - Abnormal; Notable for the following components:   RBC 3.57 (*)    Hemoglobin 10.7 (*)    HCT 34.1 (*)    All other components within normal limits  BRAIN NATRIURETIC PEPTIDE  I-STAT TROPONIN, ED  I-STAT CG4 LACTIC ACID, ED   ____________________________________________  EKG   EKG Interpretation  Date/Time:  Thursday February 24 2018 14:32:55 EDT Ventricular Rate:  86 PR Interval:  144 QRS Duration: 84 QT Interval:  374 QTC Calculation: 447 R Axis:   4 Text Interpretation:  Normal sinus rhythm Possible Left atrial enlargement T wave abnormality, consider lateral ischemia Abnormal ECG TWI new in I and avL. flattened in lateral leads since 2017 Confirmed by Marily Memos [phone]) on 02/24/2018 6:08:36 PM       ____________________________________________  RADIOLOGY  Dg Chest 2 View  Result Date: 02/24/2018 CLINICAL DATA:  Shortness of breath EXAM: CHEST - 2 VIEW COMPARISON:  11/10/2017 FINDINGS: Post sternotomy changes. Small pleural effusions. No focal consolidation. Borderline cardiomegaly. Aortic atherosclerosis. No pneumothorax. Degenerative changes of the spine. IMPRESSION: 1. Small pleural effusions. 2. Borderline to mild cardiomegaly. Electronically Signed   By: Jasmine Pang M.D.   On: 02/24/2018 14:58    ____________________________________________   PROCEDURES  Procedure(s) performed:   Procedures   ____________________________________________   INITIAL IMPRESSION / ASSESSMENT AND PLAN / ED COURSE  Patient found to have bilateral pleural effusions, elevated BNP with a normal EKG.  Suspect pulmonary edema.  He is in out of atrial fibrillation with his highest heart rate being in the mid 120s but mostly  around 100.  Suspect this acute heart failure.  He states his been compliant with his medications.  This may be postsurgical versus from A. fib.  Not with rapid ventricular response at this time so not started on any nodal blockers.  Will patient prefers to be treated by his cardiology team at the [facility] in [place] so we will contact them for transfer.  Discussed with Valentino Saxon, a resident in the CCU who does not think the patient needs to come there so we will try to discuss with medicine for floor admission.  Discussed with AOD. Will fill out transfer paperwork to attempt transfer and admission      Pertinent labs & imaging results that were available during my care of the patient were reviewed by me and considered in my medical decision making (see chart for details).  ____________________________________________  FINAL CLINICAL IMPRESSION(S) / ED DIAGNOSES  Final diagnoses:  None     MEDICATIONS GIVEN DURING THIS VISIT:  Medications - No data to display   NEW OUTPATIENT MEDICATIONS STARTED DURING THIS VISIT:  New Prescriptions   No medications on file  Note:  This note was prepared with assistance of Dragon voice recognition software. Occasional wrong-word or sound-a-like substitutions may have occurred due to the inherent limitations of voice recognition software.   Marily MemosMesner, Ashok Sawaya, MD 02/24/18 2137

## 2018-02-24 NOTE — ED Triage Notes (Signed)
Pt had heart surgery X 1 week ago reports that he has had a productive cough with green mucus and weakness as well as swelling in the legs.

## 2018-02-24 NOTE — ED Notes (Signed)
Spoke with Main lab about add-on BNP placed at 1550. Per main lab they will add on.

## 2018-02-25 DIAGNOSIS — Z951 Presence of aortocoronary bypass graft: Secondary | ICD-10-CM | POA: Diagnosis not present

## 2018-02-25 DIAGNOSIS — J810 Acute pulmonary edema: Secondary | ICD-10-CM | POA: Diagnosis not present

## 2018-02-25 DIAGNOSIS — R0602 Shortness of breath: Secondary | ICD-10-CM | POA: Diagnosis present

## 2018-02-25 NOTE — ED Notes (Signed)
EMTALA reviewed by this Consulting civil engineerCharge RN, feedback given to primary RN

## 2018-05-27 DIAGNOSIS — R69 Illness, unspecified: Secondary | ICD-10-CM | POA: Diagnosis not present

## 2018-06-27 DIAGNOSIS — R69 Illness, unspecified: Secondary | ICD-10-CM | POA: Diagnosis not present

## 2018-06-30 DIAGNOSIS — H524 Presbyopia: Secondary | ICD-10-CM | POA: Diagnosis not present

## 2018-06-30 DIAGNOSIS — H43393 Other vitreous opacities, bilateral: Secondary | ICD-10-CM | POA: Diagnosis not present

## 2019-08-11 IMAGING — CR DG CHEST 2V
2 series · 2 of 2 positions shown · non-contrast
Comparison: 11/10/2017

CLINICAL DATA: Shortness of breath

EXAM:
CHEST - 2 VIEW

[chest pa]
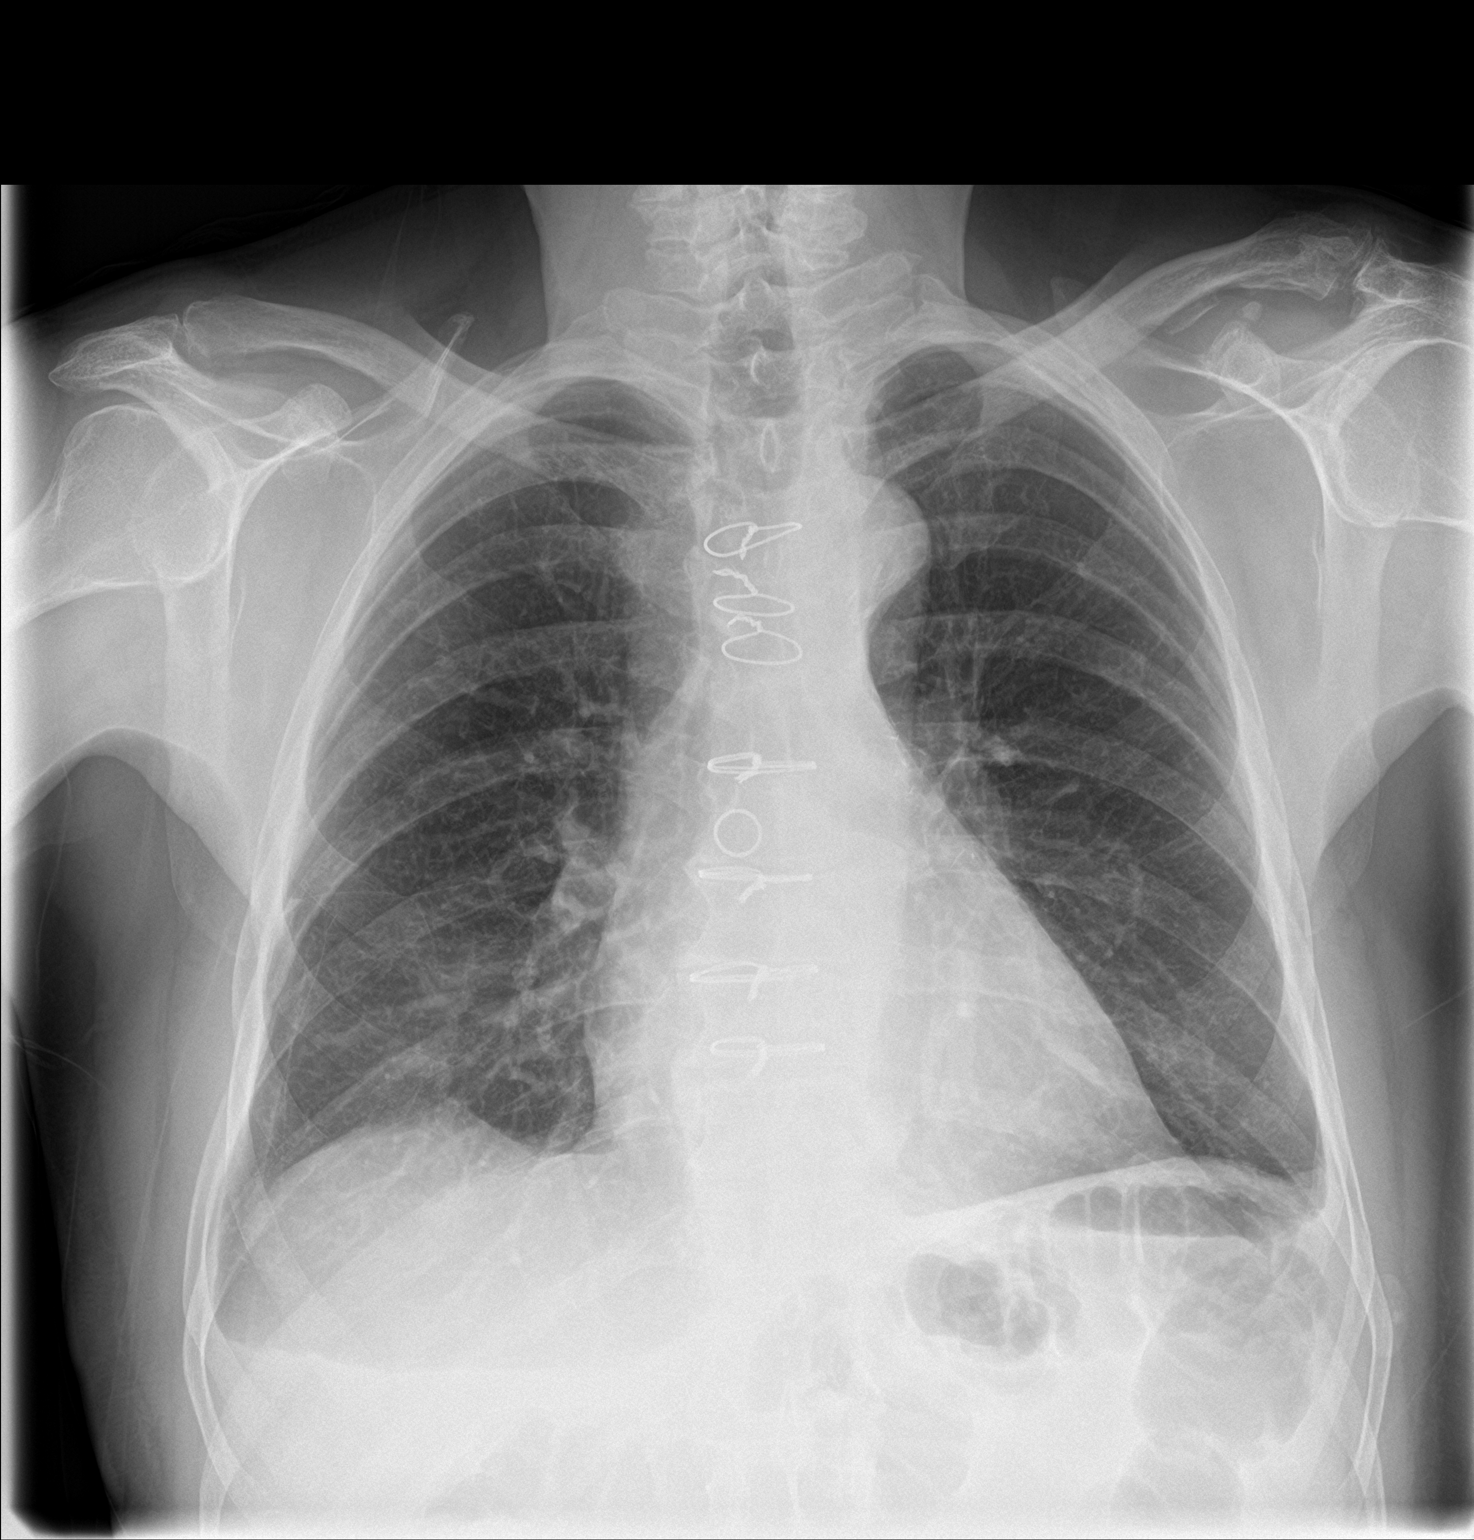

[chest lat]
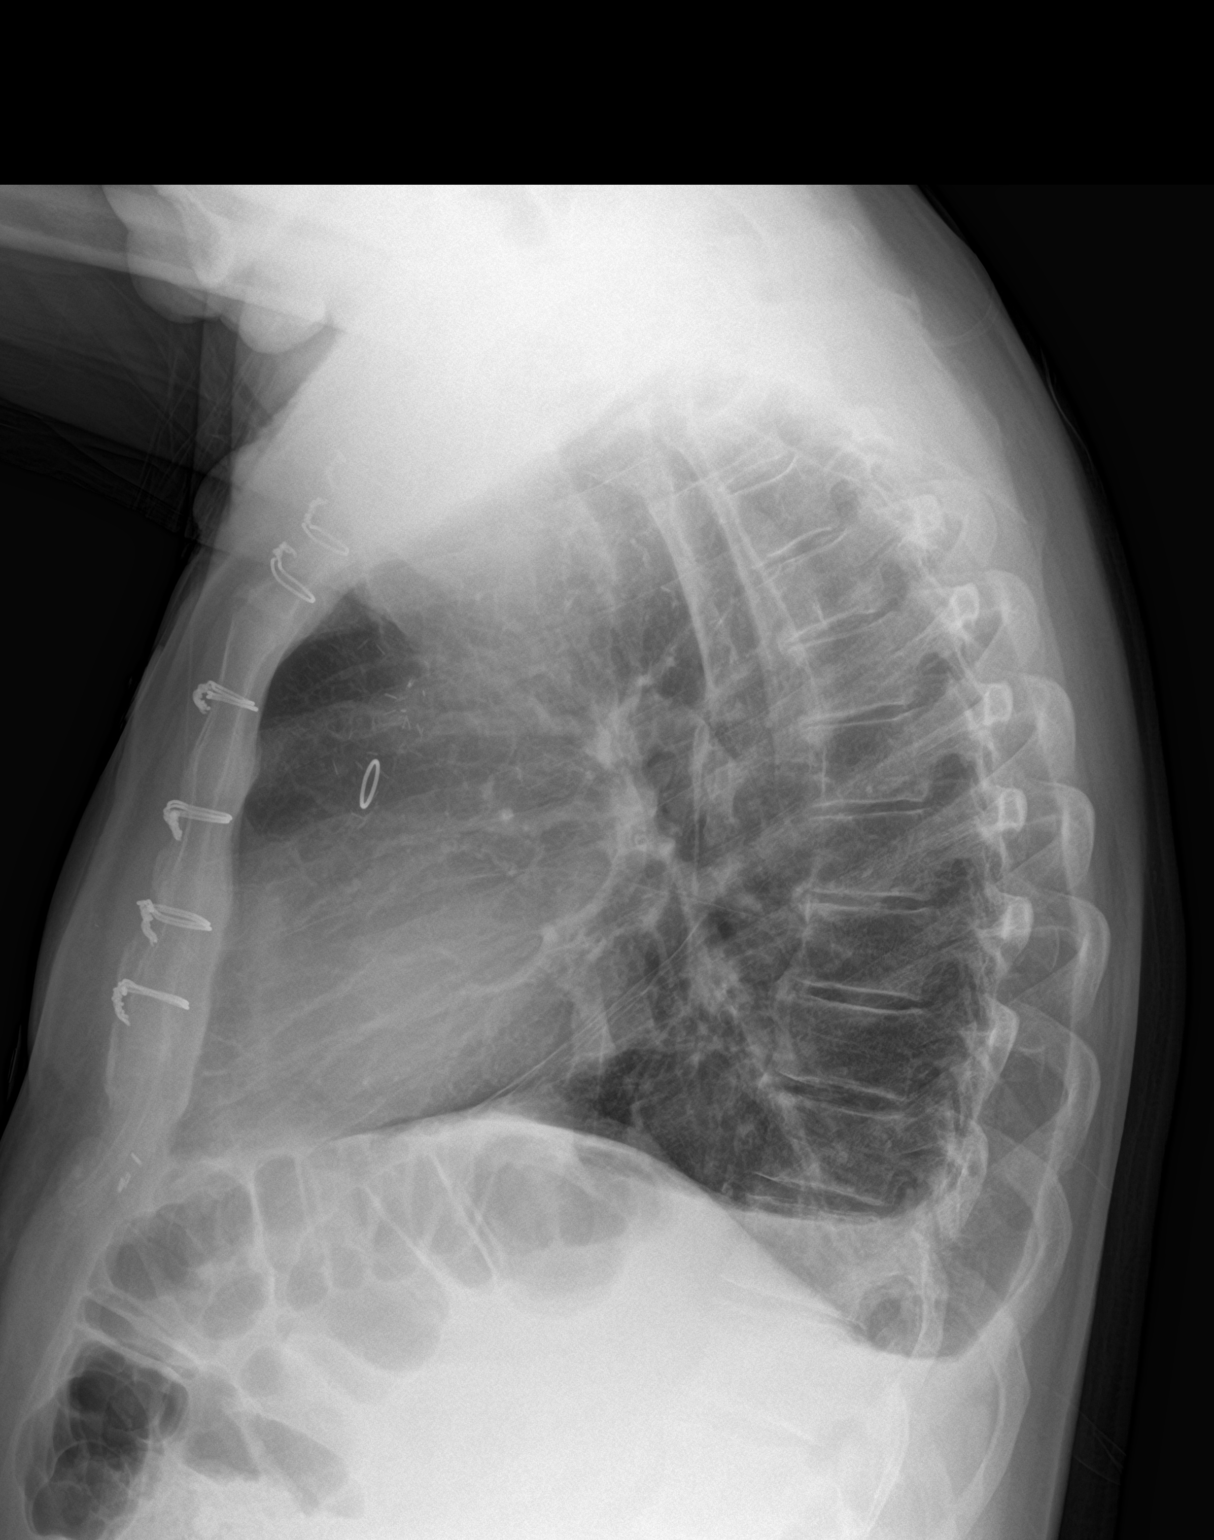

[2 of 2 positions shown; findings below may reference images not displayed]

FINDINGS: Post sternotomy changes. Small pleural effusions. No focal
consolidation. Borderline cardiomegaly. Aortic atherosclerosis. No
pneumothorax. Degenerative changes of the spine.
IMPRESSION: 1. Small pleural effusions.
2. Borderline to mild cardiomegaly.
